# Patient Record
Sex: Female | Born: 2000 | Race: White | Hispanic: No | Marital: Single | Smoking: Never smoker
Health system: Southern US, Community
[De-identification: ages and names within clinical notes are randomized; demographics above are authoritative.]

## PROBLEM LIST (undated history)

## (undated) DIAGNOSIS — D239 Other benign neoplasm of skin, unspecified: Secondary | ICD-10-CM

## (undated) DIAGNOSIS — F419 Anxiety disorder, unspecified: Secondary | ICD-10-CM

## (undated) HISTORY — PX: NO PAST SURGERIES: SHX2092

## (undated) HISTORY — DX: Other benign neoplasm of skin, unspecified: D23.9

## (undated) HISTORY — PX: WISDOM TOOTH EXTRACTION: SHX21

## (undated) HISTORY — DX: Anxiety disorder, unspecified: F41.9

---
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
## 2019-03-18 ENCOUNTER — Ambulatory Visit (INDEPENDENT_AMBULATORY_CARE_PROVIDER_SITE_OTHER): Payer: Managed Care, Other (non HMO) | Admitting: Obstetrics and Gynecology

## 2019-03-18 ENCOUNTER — Encounter: Payer: Self-pay | Admitting: Obstetrics and Gynecology

## 2019-03-18 ENCOUNTER — Other Ambulatory Visit: Payer: Self-pay

## 2019-03-18 VITALS — BP 132/80 | HR 109 | Ht 69.0 in | Wt 163.0 lb

## 2019-03-18 DIAGNOSIS — N760 Acute vaginitis: Secondary | ICD-10-CM

## 2019-03-18 DIAGNOSIS — N3001 Acute cystitis with hematuria: Secondary | ICD-10-CM

## 2019-03-18 DIAGNOSIS — Z113 Encounter for screening for infections with a predominantly sexual mode of transmission: Secondary | ICD-10-CM | POA: Diagnosis not present

## 2019-03-18 MED ORDER — NITROFURANTOIN MONOHYD MACRO 100 MG PO CAPS
100.0000 mg | ORAL_CAPSULE | Freq: Two times a day (BID) | ORAL | 0 refills | Status: DC
Start: 2019-03-18 — End: 2019-09-21

## 2019-03-18 NOTE — Progress Notes (Signed)
Patient ID: Colleen Lozano, female   DOB: 2001/07/01, 18 y.o.   MRN: [id number]  Reason for Consult: Exposure to STD (Poss uti, frequent urination, buring and LQ pain )   Referred by [place]  Subjective:     HPI:  Colleen Lozano is a 18 y.o. female . She was recently sexually active for the first time 1 week ago. She is concerned that she may have gotten an STD, may be pregnant, and may have a urinary tract infection. She has been using an IUD for birth control. She has symptoms of pain with urination, burning with urination and increased urinary frequency. She feels like she has noticed a change in her vaginal odor.  Reports that she has not had a period since insertion of the IUD several months ago.   Past Medical History:  Diagnosis Date  . Anxiety   . Epidermal nevus 18 months   Family History  Problem Relation Age of Onset  . Hypertension Mother   . Hypertension Father    Past Surgical History:  Procedure Laterality Date  . NO PAST SURGERIES      Short Social History:  Social History   Tobacco Use  . Smoking status: Never Smoker  . Smokeless tobacco: Never Used  Substance Use Topics  . Alcohol use: No    Allergies  Allergen Reactions  . Clindamycin/Lincomycin Anaphylaxis    Current Outpatient Medications  Medication Sig Dispense Refill  . acitretin (SORIATANE) 25 MG capsule Take 35 mg by mouth daily before breakfast.    . fluocinonide ointment (LIDEX) 0.05 % Apply to affected nails daily    . fluticasone (FLONASE) 50 MCG/ACT nasal spray Place 1 spray into both nostrils daily.    Marland Kitchen levocetirizine (XYZAL) 5 MG tablet Take 5 mg by mouth every evening.    . montelukast (SINGULAIR) 10 MG tablet Take 10 mg by mouth at bedtime.    . sertraline (ZOLOFT) 50 MG tablet     . Levonorgestrel (KYLEENA) 19.5 MG IUD 1 each (19.5 mg total) by Intrauterine route once for 1 dose. 1 Intra Uterine Device 0   No current facility-administered medications for  this visit.     Review of Systems  Constitutional: Negative for chills, fatigue, fever and unexpected weight change.  HENT: Negative for trouble swallowing.  Eyes: Negative for loss of vision.  Respiratory: Negative for cough, shortness of breath and wheezing.  Cardiovascular: Negative for chest pain, leg swelling, palpitations and syncope.  GI: Negative for abdominal pain, blood in stool, diarrhea, nausea and vomiting.  GU: Positive for difficulty urinating, dysuria and frequency. Negative for hematuria.  Musculoskeletal: Negative for back pain, leg pain and joint pain.  Skin: Negative for rash.  Neurological: Negative for dizziness, headaches, light-headedness, numbness and seizures.  Psychiatric: Negative for behavioral problem, confusion, depressed mood and sleep disturbance.        Objective:  Objective   Vitals:   03/18/19 1438  BP: 132/80  Pulse: (!) 109  Weight: 163 lb (73.9 kg)  Height: 5\' 9"  (1.753 m)   Body mass index is 24.07 kg/m.  Physical Exam Vitals signs and nursing note reviewed. Exam conducted with a chaperone present.  Constitutional:      Appearance: She is well-developed.  HENT:     Head: Normocephalic and atraumatic.  Eyes:     Pupils: Pupils are equal, round, and reactive to light.  Cardiovascular:     Rate and Rhythm: Normal rate and regular rhythm.  Pulmonary:     Effort: Pulmonary effort is normal. No respiratory distress.  Genitourinary:    General: Normal vulva.     Pubic Area: No rash or pubic lice.      Labia:        Right: No rash, tenderness or lesion.        Left: No rash, tenderness or lesion.      Vagina: Normal. No vaginal discharge or lesions.     Cervix: Normal.     Uterus: Normal.      Adnexa: Right adnexa normal and left adnexa normal.       Right: No tenderness or fullness.         Left: No tenderness or fullness.       Skin:    General: Skin is warm and dry.  Neurological:     Mental Status: She is alert and  oriented to person, place, and time.  Psychiatric:        Behavior: Behavior normal.        Thought Content: Thought content normal.        Judgment: Judgment normal.        Assessment/Plan:     18 yo  1. Suspected UTI, rx sent for macrobid. Urine culture pending.  2. Desires STD testing- Nuswab sent, will follow up in 1 month for ST lab testing.  3. Continue contraception with IUD  Discussed safe sex practices for avoiding STD treatment.  Patient reports that she has been vaccinated for HPV before.  Helped register her mychart account.   More than 25 minutes were spent face to face with the patient in the room with more than 50% of the time spent providing counseling and discussing the plan of management.      Adrian Prows MD [facility], [facility] 03/18/2019 2:48 PM

## 2019-03-18 NOTE — Patient Instructions (Signed)
Preventing Sexually Transmitted Infections, Adult Sexually transmitted infections (STIs) are diseases that are passed (transmitted) from person to person through bodily fluids exchanged during sex or sexual contact. Bodily fluids include saliva, semen, blood, vaginal mucus, and urine. You may have an increased risk for developing an STI if you have unprotected oral, vaginal, or anal sex. Some common STIs include:  Herpes.  Hepatitis B.  Chlamydia.  Gonorrhea.  Syphilis.  HPV (human papillomavirus).  HIV (human immunodeficiency virus), the virus that can cause AIDS (acquired immunodeficiency syndrome). How can I protect myself from sexually transmitted infections? The only way to completely prevent STIs is not to have sex of any kind (practice abstinence). This includes oral, vaginal, or anal sex. If you are sexually active, take these actions to lower your risk of getting an STI:  Have only one sex partner (be monogamous) or limit the number of sexual partners you have.  Stay up-to-date on immunizations. Certain vaccines can lower your risk of getting certain STIs, such as: ? Hepatitis A and B vaccines. You may have been vaccinated as a young child, but likely need a booster shot as a teen or young adult. ? HPV vaccine.  Use methods that prevent the exchange of body fluids between partners (barrier protection) every time you have sex. Barrier protection can be used during oral, vaginal, or anal sex. Commonly used barrier methods include: ? Female condom. ? Female condom. ? Dental dam.  Get tested regularly for STIs. Have your sexual partner get tested regularly as well.  Avoid mixing alcohol, drugs, and sex. Alcohol and drug use can affect your ability to make good decisions and can lead to risky sexual behaviors.  Ask your health care provider about taking pre-exposure prophylaxis (PrEP) to prevent HIV infection if you: ? Have a HIV-positive sexual partner. ? Have multiple sexual  partners or partners who do not know their HIV status, and do not regularly use a condom during sex. ? Use injection drugs and share needles. Birth control pills, injections, implants, and intrauterine devices (IUDs) do not protect against STIs. To prevent both STIs and pregnancy, always use a condom with another form of birth control. Some STIs, such as herpes, are spread through skin to skin contact. A condom does not protect you from getting such STIs. If you or your partner have herpes and there is an active flare with open sores, avoid all sexual contact. Why are these changes important? Taking steps to practice safe sex protects you and others. Many STIs can be cured. However, some STIs are not curable and will affect you for the rest of your life. STIs can be passed on to another person even if you do not have symptoms. What can happen if changes are not made? Certain STIs may:  Require you to take medicine for the rest of your life.  Affect your ability to have children (your fertility).  Increase your risk for developing another STI or certain serious health conditions, such as: ? Cervical cancer. ? Head and neck cancer. ? Pelvic inflammatory disease (PID) in women. ? Organ damage or damage to other parts of your body, if the infection spreads.  Be passed to a baby during childbirth. How are sexually transmitted infections treated? If you or your partner know or think that you may have an STI:  Talk with your health care provider about what can be done to treat it. Some STIs can be treated and cured with medicines.  For curable STIs, you and  your partner should avoid sex during treatment and for several days after treatment is complete.  You and your partner should both be treated at the same time, if there is any chance that your partner is infected as well. If you get treatment but your partner does not, your partner can re-infect you when you resume sexual contact.  Do not  have unprotected sex. Where to find more information Learn more about sexually transmitted diseases and infections from:  Centers for Disease Control and Prevention: ? More information about specific STIs: AppraiserFraud.fi ? Find places to get sexual health counseling and treatment for free or for a low cost: gettested.StoreMirror.com.cy  U.S. Department of Health and Human Services: http://white.info/.html Summary  The only way to completely prevent STIs is not to have sex (practice abstinence), including oral, vaginal, or anal sex.  STIs can spread through saliva, semen, blood, vaginal mucus, urine, or sexual contact.  If you do have sex, limit your number of sexual partners and use a barrier protection method every time you have sex.  If you develop an STI, get treated right away and ask your partner to be treated as well. Do not resume having sex until both of you have completed treatment for the STI. This information is not intended to replace advice given to you by your health care provider. Make sure you discuss any questions you have with your health care provider. Document Released: 11/14/2016 Document Revised: 04/24/2018 Document Reviewed: 11/14/2016 Elsevier Interactive Patient Education  2019 [employer] Practicing safe sex means taking steps before and during sex to reduce your risk of:  Getting an STD (sexually transmitted disease).  Giving your partner an STD.  Unwanted pregnancy. How can I practice safe sex? To practice safe sex:  Limit your sexual partners to only one partner who is having sex with only you.  Avoid using alcohol and recreational drugs before having sex. These substances can affect your judgment.  Before having sex with a new partner: ? Talk to your partner about past partners, past STDs, and drug use. ? You and your partner should be screened for STDs and discuss the  results with each other.  Check your body regularly for sores, blisters, rashes, or unusual discharge. If you notice any of these problems, visit your health care provider.  If you have symptoms of an infection or you are being treated for an STD, avoid sexual contact.  While having sex, use a condom. Make sure to: ? Use a condom every time you have vaginal, oral, or anal sex. Both females and males should wear condoms during oral sex. ? Keep condoms in place from the beginning to the end of sexual activity. ? Use a latex condom, if possible. Latex condoms offer the best protection. ? Use only water-based lubricants or oils to lubricate a condom. Using petroleum-based lubricants or oils will weaken the condom and increase the chance that it will break.  See your health care provider for regular screenings, exams, and tests for STDs.  Talk with your health care provider about the form of birth control (contraception) that is best for you.  Get vaccinated against hepatitis B and human papillomavirus (HPV).  If you are at risk of being infected with HIV (human immunodeficiency virus), talk with your health care provider about taking a prescription medicine to prevent HIV infection. You are considered at risk for HIV if: ? You are a man who has sex with other men. ? You are a  heterosexual man or woman who is sexually active with more than one partner. ? You take drugs by injection. ? You are sexually active with a partner who has HIV. This information is not intended to replace advice given to you by your health care provider. Make sure you discuss any questions you have with your health care provider. Document Released: 12/26/2004 Document Revised: 04/03/2016 Document Reviewed: 10/08/2015 Elsevier Interactive Patient Education  2019 [employer].

## 2019-03-21 LAB — URINE CULTURE

## 2019-03-23 LAB — NUSWAB VAGINITIS PLUS (VG+)
Candida albicans, NAA: NEGATIVE
Candida glabrata, NAA: NEGATIVE
Chlamydia trachomatis, NAA: NEGATIVE
Neisseria gonorrhoeae, NAA: NEGATIVE
Trich vag by NAA: NEGATIVE

## 2019-03-23 NOTE — Progress Notes (Signed)
Released to mychart with note, on antibiotics for UTI.

## 2019-04-19 ENCOUNTER — Other Ambulatory Visit: Payer: Managed Care, Other (non HMO)

## 2019-09-21 ENCOUNTER — Other Ambulatory Visit: Payer: Self-pay

## 2019-09-21 ENCOUNTER — Encounter: Payer: Self-pay | Admitting: Emergency Medicine

## 2019-09-21 ENCOUNTER — Ambulatory Visit
Admission: EM | Admit: 2019-09-21 | Discharge: 2019-09-21 | Disposition: A | Discharge status: Home / Self Care | Payer: Managed Care, Other (non HMO) | Attending: Family Medicine | Admitting: Family Medicine

## 2019-09-21 DIAGNOSIS — H579 Unspecified disorder of eye and adnexa: Secondary | ICD-10-CM | POA: Diagnosis not present

## 2019-09-21 DIAGNOSIS — H109 Unspecified conjunctivitis: Secondary | ICD-10-CM

## 2019-09-21 MED ORDER — MOXIFLOXACIN HCL 0.5 % OP SOLN: 1.0000 [drp] | Freq: Three times a day (TID) | OPHTHALMIC | 0 refills | Status: AC

## 2019-09-21 NOTE — ED Provider Notes (Signed)
[facility] ____________________________________________  Time seen: Approximately 6:46 PM  I have reviewed the triage vital signs and the nursing notes.   HISTORY  Chief Complaint No chief complaint on file.   HPI Colleen Lozano is a 18 y.o. female presenting for evaluation of bilateral eye redness, itching and drainage.  Patient reports initially started last Wednesday or Thursday and only to the right eye.  Reports shortly been went to her left eye.  Reports she has been having copious yellowish drainage with matting, stating the matting's so much that she has to pry her eyes open in the morning and some during the day.  Denies foreign body sensation.  Denies vision changes or loss of vision.  States her eyes felt itchy and irritated.  Does not use glasses or contacts.  Denies known trauma, foreign body, chemical exposure.  Reports her boyfriend now has similar complaints in the last few days.  Did do a mdlive visit, and was given Tobrex which she has used for the last 2 days without resolution.  History of seasonal allergies.  Denies recent cough, congestion, fevers or sickness.  Patient's last menstrual period was 09/14/2019.Denies pregnancy.    Past Medical History:  Diagnosis Date  . Anxiety   . Epidermal nevus 18 months    There are no active problems to display for this patient.   Past Surgical History:  Procedure Laterality Date  . NO PAST SURGERIES       No current facility-administered medications for this encounter.   Current Outpatient Medications:  .  acitretin (SORIATANE) 25 MG capsule, Take 35 mg by mouth daily before breakfast., Disp: , Rfl:  .  levocetirizine (XYZAL) 5 MG tablet, Take 5 mg by mouth every evening., Disp: , Rfl:  .  Levonorgestrel (KYLEENA) 19.5 MG IUD, 1 each (19.5 mg total) by Intrauterine route once for 1 dose., Disp: 1 Intra Uterine Device, Rfl: 0 .  montelukast (SINGULAIR) 10 MG tablet, Take 10 mg by mouth at bedtime.,  Disp: , Rfl:  .  sertraline (ZOLOFT) 50 MG tablet, , Disp: , Rfl:  .  tobramycin (TOBREX) 0.3 % ophthalmic solution, , Disp: , Rfl:  .  moxifloxacin (VIGAMOX) 0.5 % ophthalmic solution, Place 1 drop into both eyes 3 (three) times daily for 7 days., Disp: 3 mL, Rfl: 0  Allergies Clindamycin/lincomycin  Family History  Problem Relation Age of Onset  . Hypertension Mother   . Hypertension Father     Social History Social History   Tobacco Use  . Smoking status: Never Smoker  . Smokeless tobacco: Never Used  Substance Use Topics  . Alcohol use: No  . Drug use: No    Review of Systems Constitutional: No fever/chills Eyes: No visual changes. As above.  ENT: No sore throat. Cardiovascular: Denies chest pain. Respiratory: Denies shortness of breath. Gastrointestinal: No abdominal pain.   Musculoskeletal: Negative for back pain. Skin: Negative for rash.   ____________________________________________   PHYSICAL EXAM:  VITAL SIGNS: ED Triage Vitals  Enc Vitals Group     BP 09/21/19 1717 123/85     Pulse Rate 09/21/19 1717 75     Resp 09/21/19 1717 18     Temp 09/21/19 1717 98.9 F (37.2 C)     Temp Source 09/21/19 1717 Oral     SpO2 09/21/19 1717 100 %     Weight 09/21/19 1710 165 lb (74.8 kg)     Height 09/21/19 1710 5\' 9"  (1.753 m)     Head  Circumference --      Peak Flow --      Pain Score 09/21/19 1709 4     Pain Loc --      Pain Edu? --      Excl. in GC? --     Visual Acuity  Right Eye Distance: 20/25 Left Eye Distance: 20/25 Bilateral Distance: 20/25    Constitutional: Alert and oriented. Well appearing and in no acute distress. Eyes: Bilateral conjunctival diffuse injection with bilateral yellowish drainage and yellow surrounding crusting.  No surrounding tenderness, swelling or erythema bilaterally.  No appearance of foreign body.  PERRL. EOMI. ENT      Head: Normocephalic and atraumatic.      Nose: No congestion Cardiovascular: Normal rate,  regular rhythm. Grossly normal heart sounds.  Good peripheral circulation. Respiratory: Normal respiratory effort without tachypnea nor retractions. Breath sounds are clear and equal bilaterally. No wheezes, rales, rhonchi. Musculoskeletal: Steady gait. Neurologic:  Normal speech and language.Speech is normal. No gait instability.  Skin:  Skin is warm, dry and intact. No rash noted. Psychiatric: Mood and affect are normal. Speech and behavior are normal. Patient exhibits appropriate insight and judgment   ___________________________________________   LABS (all labs ordered are listed, but only abnormal results are displayed)  Labs Reviewed - No data to display ____________________________________________   PROCEDURES Procedures   INITIAL IMPRESSION / ASSESSMENT AND PLAN / ED COURSE  Pertinent labs & imaging results that were available during my care of the patient were reviewed by me and considered in my medical decision making (see chart for details).  Well-appearing patient.  No acute distress.  Suspect bacterial conjunctivitis.  Will treat with Vigamox.  Stop previous antibiotic drops.  Avoidance of rubbing.  Cool compresses.  Good hand hygiene.  Supportive care.Discussed indication, risks and benefits of medications with patient.  Discussed follow up with Primary care physician this week. Discussed follow up and return parameters including no resolution or any worsening concerns. Patient verbalized understanding and agreed to plan.   ____________________________________________   FINAL CLINICAL IMPRESSION(S) / ED DIAGNOSES  Final diagnoses:  Bacterial conjunctivitis     ED Discharge Orders         Ordered    moxifloxacin (VIGAMOX) 0.5 % ophthalmic solution  3 times daily     09/21/19 1744           Note: This dictation was prepared with Dragon dictation along with smaller phrase technology. Any transcriptional errors that result from this process are unintentional.          Marylene Land, NP 09/21/19 [phone]

## 2019-09-21 NOTE — Discharge Instructions (Signed)
Take medication as prescribed. Avoid rubbing. Good hand hygiene. Continue allergy medication.   Follow up with your primary care physician this week as needed. Return to Urgent care for new or worsening concerns.

## 2019-09-21 NOTE — ED Triage Notes (Addendum)
Patient in office today c/o bilateral conjunctivitis. X 5d  Contacted MD live on 09/18/2019 and antibiotic was Tobrex prescribed. Pt states film over eyes aw/ crusty OTC: none  Visual acuity completed: Bilateral 20/25 w/o correction                                         Left 20/25                                         Right 20/25

## 2019-11-24 IMAGING — CR DG LUMBAR SPINE COMPLETE 4+V
5 series · 5 of 5 positions shown · non-contrast
Comparison: None.

CLINICAL DATA: Chronic low back pain

EXAM:
LUMBAR SPINE - COMPLETE 4+ VIEW

[l-spine ap]
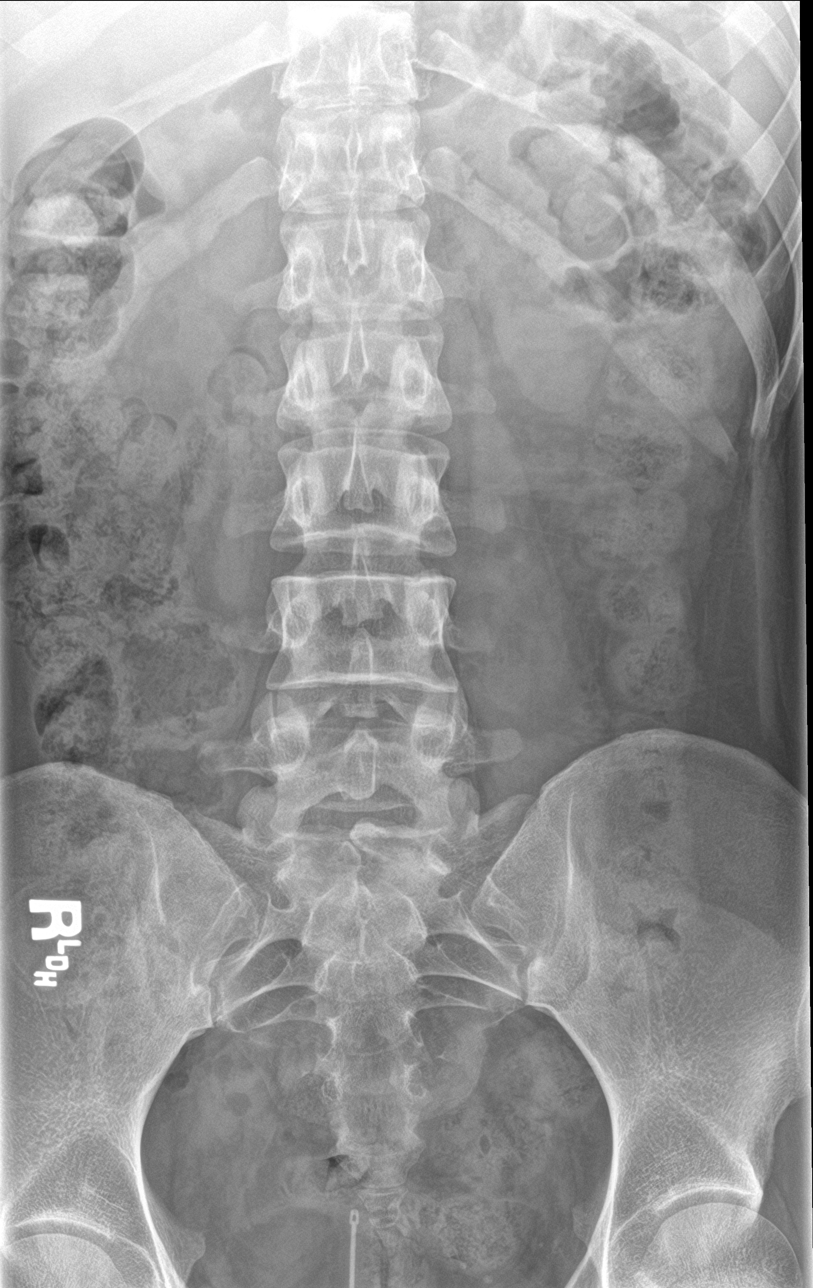

[l-spine obl (1 of 2)]
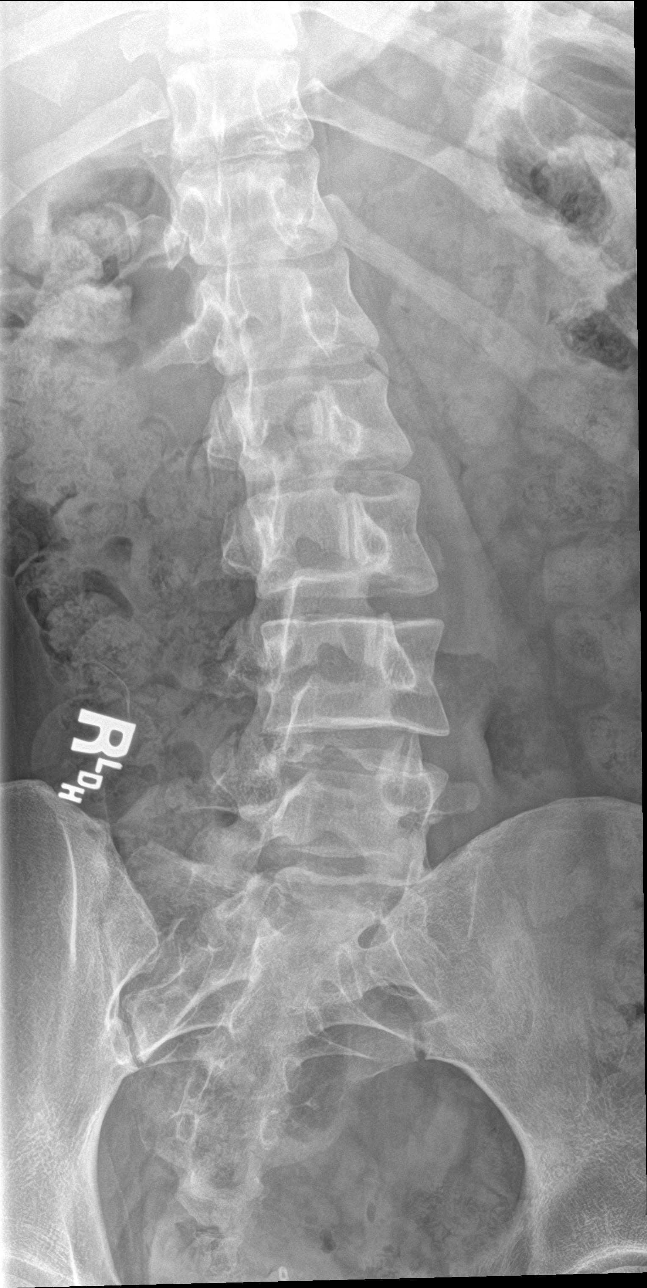

[l-spine obl (2 of 2)]
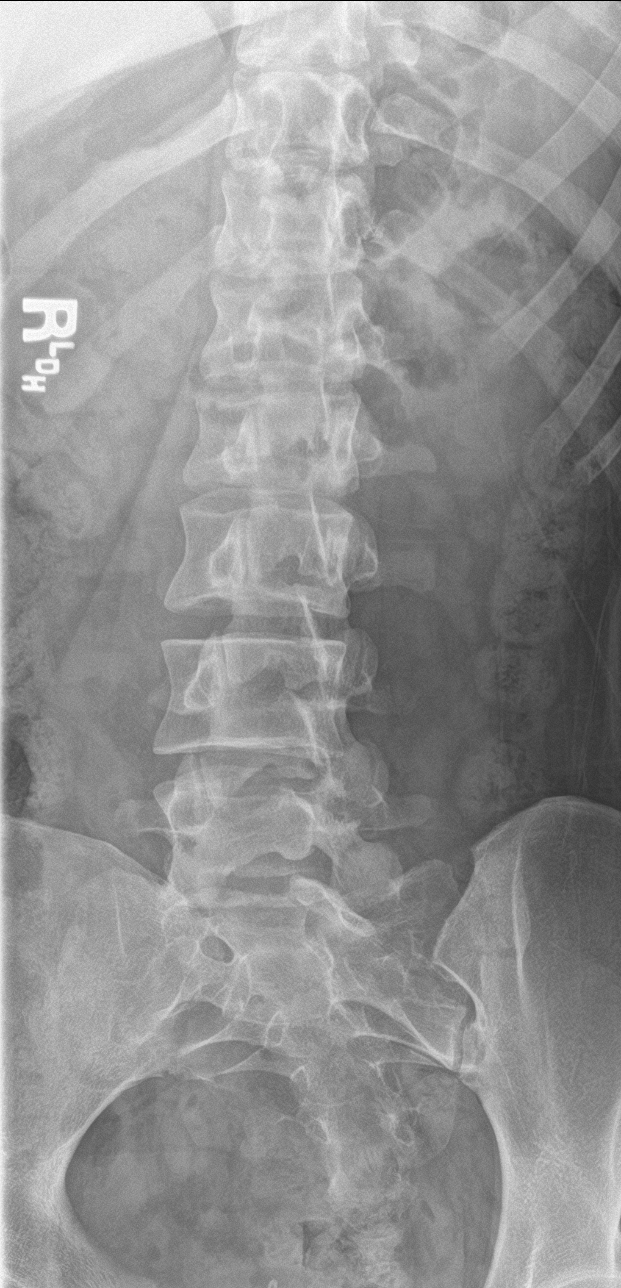

[l-spine lat]
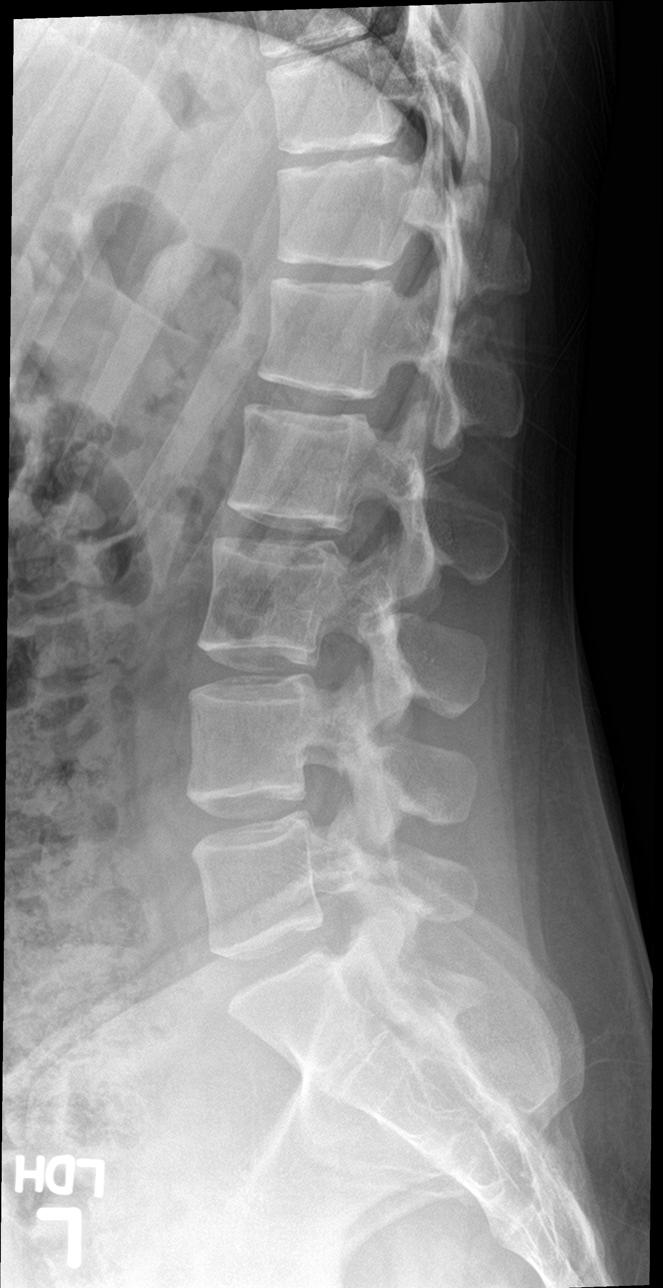

[l-spine spot]
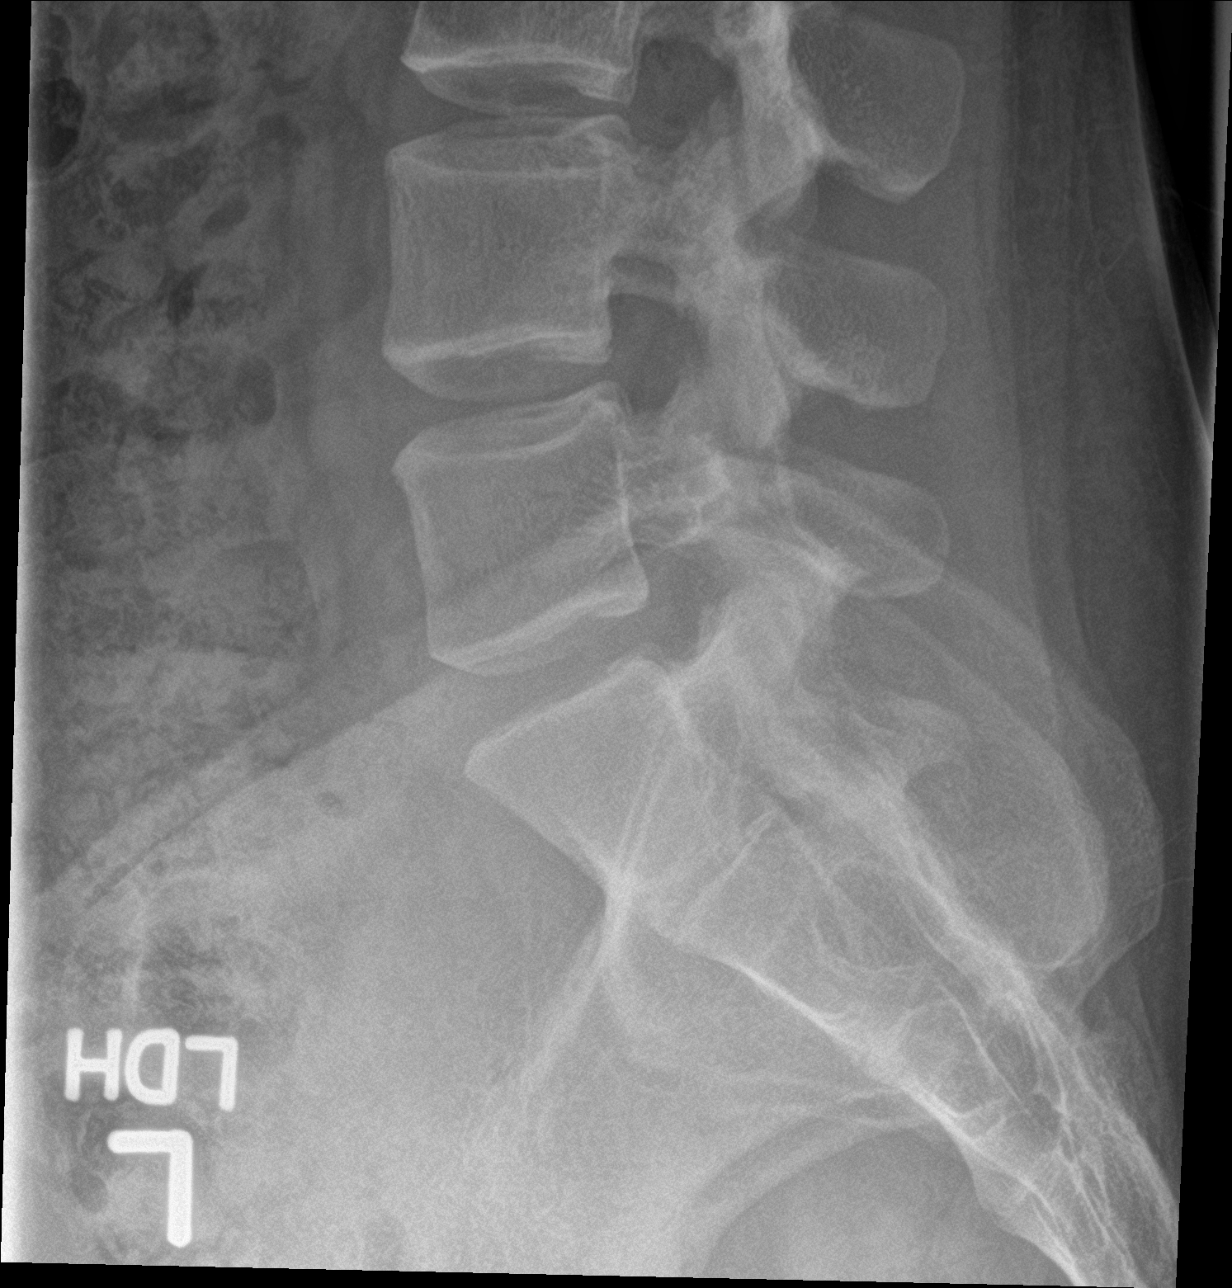

[5 of 5 positions shown; findings below may reference images not displayed]

FINDINGS: Five lumbar-type vertebral bodies.

Normal lumbar lordosis.

No evidence of fracture or dislocation. Vertebral body heights and
intervertebral disc spaces are maintained.

Visualized bony pelvis appears intact.
IMPRESSION: Negative.

## 2020-02-17 ENCOUNTER — Ambulatory Visit: Payer: Managed Care, Other (non HMO) | Attending: Internal Medicine

## 2020-02-17 DIAGNOSIS — Z20822 Contact with and (suspected) exposure to covid-19: Secondary | ICD-10-CM

## 2020-02-19 LAB — NOVEL CORONAVIRUS, NAA: SARS-CoV-2, NAA: NOT DETECTED

## 2020-06-01 ENCOUNTER — Ambulatory Visit: Payer: Self-pay

## 2020-06-27 ENCOUNTER — Telehealth: Payer: Self-pay

## 2020-06-27 NOTE — Telephone Encounter (Signed)
I sent Colleen Lozano a message.

## 2020-06-27 NOTE — Telephone Encounter (Signed)
Pt aware no appts available; BV doesn't cause pain; go back to urgent care or ED unless can wait to be seen by [place] next week. Pt states she'll be seen either at urgent care of ED.

## 2020-06-27 NOTE — Telephone Encounter (Signed)
Velva Harman-      It sounds like there may be something else going on, as BV is usually not painful.  I am confused- does Urgent Care turn people away because there are "no appointments"?  The whole point of Urgent care is for walk ins! LOL.   She could go to the ED if she chooses....  If I have time racing over from being on call now, then I can try to call the patient. This needs more exploration and information.

## 2020-06-27 NOTE — Telephone Encounter (Addendum)
Pt's mom calling; pt was seen at urgent care; dx'd c BV; the medication they gave her is not clearing it up; is now in a lot of pain.  [phone]  Pt states went to urgent care ~3wks ago; front desk said there were no openings. Adv needed to be seen either by [place], urgent care, or primary care which she has seen as well with no help just confusion. Pt doesn't know what to do.  Adv I will try to get her seen this week.

## 2020-08-08 ENCOUNTER — Other Ambulatory Visit: Payer: Self-pay

## 2020-08-08 ENCOUNTER — Ambulatory Visit
Admission: RE | Admit: 2020-08-08 | Discharge: 2020-08-08 | Disposition: A | Discharge status: Home / Self Care | Payer: Managed Care, Other (non HMO) | Source: Ambulatory Visit | Attending: Family Medicine | Admitting: Family Medicine

## 2020-08-08 VITALS — BP 131/81 | HR 82 | Temp 98.5°F | Resp 18 | Ht 69.0 in | Wt 160.0 lb

## 2020-08-08 DIAGNOSIS — U071 COVID-19: Secondary | ICD-10-CM | POA: Diagnosis not present

## 2020-08-08 DIAGNOSIS — R05 Cough: Secondary | ICD-10-CM | POA: Diagnosis not present

## 2020-08-08 DIAGNOSIS — Z20822 Contact with and (suspected) exposure to covid-19: Secondary | ICD-10-CM

## 2020-08-08 NOTE — Discharge Instructions (Addendum)
You were seen at the Urgent Care after testing positive for COVID Take OTC pain medication for headache. Be sure to stay hydrated . Follow up with your PCP as needed.   If you haven't already, sign up for My Chart to have easy access to your labs results, and communication with your primary care physician.  Dr. Susa Simmonds

## 2020-08-08 NOTE — ED Provider Notes (Signed)
[facility]    CSN: [id number] Arrival date & time: 08/08/20  1401      History   Chief Complaint Chief Complaint  Patient presents with  . Appointment  . Covid Positive  . Cough  . Nasal Congestion  . loss of taste and smell    HPI Colleen Lozano is a 19 y.o. female.   HPI    Patint reports she has bad allergies.  She is use to congestion, loss of smell and taste are normal to her.  She started getting headaches, produictive cough.  Thursday last week it was negative then the next day it turned positive (home COVID test). Taking Vit D and C, mucinex and ibuprofen. Deneos fevers, sore throat, nausea, vomiting, diarrhea, shortness of breath or chest pain. Patient's aunt test poisitive and pt was last around her last week. Patient is  not vaccinated against COVID.   Past Medical History:  Diagnosis Date  . Anxiety   . Epidermal nevus 18 months    There are no problems to display for this patient.   Past Surgical History:  Procedure Laterality Date  . NO PAST SURGERIES      OB History    Gravida  0   Para  0   Term  0   Preterm  0   AB  0   Living  0     SAB  0   TAB  0   Ectopic  0   Multiple  0   Live Births  0            Home Medications    Prior to Admission medications   Medication Sig Start Date End Date Taking? Authorizing Provider  acitretin (SORIATANE) 25 MG capsule Take 35 mg by mouth daily before breakfast.   Yes [provider]  levocetirizine (XYZAL) 5 MG tablet Take 5 mg by mouth every evening.   Yes [provider]  Levonorgestrel (KYLEENA) 19.5 MG IUD 1 each (19.5 mg total) by Intrauterine route once for 1 dose. 01/28/18 01/04/75 Yes Copland, Elmo Putt B, PA-C  montelukast (SINGULAIR) 10 MG tablet Take 10 mg by mouth at bedtime.   Yes [provider]  sertraline (ZOLOFT) 50 MG tablet  03/02/19  Yes [provider]  tobramycin (TOBREX) 0.3 % ophthalmic solution  09/18/19   [provider]  fluticasone (FLONASE) 50 MCG/ACT nasal spray Place 1 spray into both nostrils daily.  09/21/19  [provider]    Family History Family History  Problem Relation Age of Onset  . Hypertension Mother   . Hypertension Father     Social History Social History   Tobacco Use  . Smoking status: Never Smoker  . Smokeless tobacco: Never Used  Vaping Use  . Vaping Use: Never used  Substance Use Topics  . Alcohol use: No  . Drug use: No     Allergies   Clindamycin/lincomycin   Review of Systems Review of Systems: See HPI    Physical Exam Triage Vital Signs ED Triage Vitals  Enc Vitals Group     BP 08/08/20 1451 131/81     Pulse Rate 08/08/20 1451 82     Resp 08/08/20 1451 18     Temp 08/08/20 1451 98.5 F (36.9 C)     Temp Source 08/08/20 1451 Oral     SpO2 08/08/20 1451 98 %     Weight 08/08/20 1452 160 lb (72.6 kg)     Height 08/08/20  1452 5\' 9"  (1.753 m)     Head Circumference --      Peak Flow --      Pain Score 08/08/20 1451 0     Pain Loc --      Pain Edu? --      Excl. in [place]? --    No data found.  Updated Vital Signs BP 131/81 (BP Location: Left Arm)   Pulse 82   Temp 98.5 F (36.9 C) (Oral)   Resp 18   Ht 5\' 9"  (1.753 m)   Wt 160 lb (72.6 kg)   LMP 08/04/2020 (Exact Date)   SpO2 98%   BMI 23.63 kg/m   Visual Acuity Right Eye Distance:   Left Eye Distance:   Bilateral Distance:    Right Eye Near:   Left Eye Near:    Bilateral Near:     Physical Exam Vitals and nursing note reviewed.  Constitutional:      General: She is not in acute distress.    Appearance: Normal appearance. She is well-developed. She is not ill-appearing.  HENT:     Head: Normocephalic and atraumatic.     Right Ear: Tympanic membrane, ear canal and external ear normal.     Left Ear: Tympanic membrane, ear canal and external ear normal.     Nose: Nose normal.     Mouth/Throat:     Mouth: Mucous membranes are moist.     Pharynx: Oropharynx  is clear. No oropharyngeal exudate or posterior oropharyngeal erythema.  Eyes:     Extraocular Movements: Extraocular movements intact.     Conjunctiva/sclera: Conjunctivae normal.     Pupils: Pupils are equal, round, and reactive to light.  Cardiovascular:     Rate and Rhythm: Normal rate and regular rhythm.     Pulses: Normal pulses.     Heart sounds: Normal heart sounds. No murmur heard.   Pulmonary:     Effort: Pulmonary effort is normal. No respiratory distress.     Breath sounds: Normal breath sounds.  Abdominal:     Palpations: Abdomen is soft.     Tenderness: There is no abdominal tenderness.  Musculoskeletal:     Cervical back: Normal range of motion and neck supple. No tenderness.  Lymphadenopathy:     Cervical: No cervical adenopathy.  Skin:    General: Skin is warm and dry.  Neurological:     Mental Status: She is alert and oriented to person, place, and time. Mental status is at baseline.  Psychiatric:        Mood and Affect: Mood normal.        Thought Content: Thought content normal.        Judgment: Judgment normal.       UC Treatments / Results  Labs (all labs ordered are listed, but only abnormal results are displayed) Labs Reviewed  SARS CORONAVIRUS 2 (TAT 6-24 HRS)    EKG   Radiology No results found.  Procedures Procedures (including critical care time)  Medications Ordered in UC Medications - No data to display  Initial Impression / Assessment and Plan / UC Course  I have reviewed the triage vital signs and the nursing notes.  Pertinent labs & imaging results that were available during my care of the patient were reviewed by me and considered in my medical decision making (see chart for details).     History consistent with viral illness. Given patient's COVID exposure and positive home test. COVID test pending. Discussed quarantine regardless  of test result.  Overall pt is well appearing, well hydrated, without respiratory distress.  Discussed symptomatic treatment.  - continue to monitor for fevers  - continue Tylenol/ Motrin as needed for discomfort - nasal saline to help with his nasal congestion - Use a cool mist humidifier at bedtime to help with breathing - Stressed hydration - Discussed ED precautions, understanding voiced  Final Clinical Impressions(s) / UC Diagnoses   Final diagnoses:  Cough with exposure to COVID-19 virus     Discharge Instructions     You were seen at the Urgent Care after testing positive for COVID Take OTC pain medication for headache. Be sure to stay hydrated . Follow up with your PCP as needed.   If you haven't already, sign up for My Chart to have easy access to your labs results, and communication with your primary care physician.  Dr. Susa Simmonds      ED Prescriptions    None     PDMP not reviewed this encounter.   Lyndee Hensen, DO 08/08/20 1519

## 2020-08-08 NOTE — ED Triage Notes (Signed)
Patient in today c/o headache, cough, nasal congestion and loss of taste and smell x 6 days. Patient took an at home covid test on 08/03/20 and it was negative. Patient took another at home covid test on 08/04/20 and it was positive. Patient requesting a PCR test.

## 2020-08-09 LAB — SARS CORONAVIRUS 2 (TAT 6-24 HRS): SARS Coronavirus 2: POSITIVE — AB

## 2020-08-10 ENCOUNTER — Telehealth (HOSPITAL_COMMUNITY): Payer: Self-pay | Admitting: Nurse Practitioner

## 2020-08-10 NOTE — Telephone Encounter (Signed)
Called to discuss with Colleen Lozano about Covid symptoms and the use of bamlanivimab, a monoclonal antibody infusion for those with mild to moderate Covid symptoms and at a high risk of hospitalization.     Pt is not qualified for this infusion due to lack of identified risk factors and co-morbid conditions- not currently having symptoms.  Symptoms reviewed as well as criteria for ending isolation.  Preventative practices reviewed. Patient verbalized understanding.     Colleen Lozano, AGNP-C

## 2020-09-10 ENCOUNTER — Ambulatory Visit: Payer: Self-pay

## 2020-11-27 ENCOUNTER — Other Ambulatory Visit: Payer: Self-pay

## 2020-11-27 ENCOUNTER — Encounter: Payer: Self-pay | Admitting: Otolaryngology

## 2020-12-05 ENCOUNTER — Other Ambulatory Visit: Payer: Self-pay

## 2020-12-05 ENCOUNTER — Other Ambulatory Visit
Admission: RE | Admit: 2020-12-05 | Discharge: 2020-12-05 | Disposition: A | Discharge status: Home / Self Care | Payer: Managed Care, Other (non HMO) | Source: Ambulatory Visit | Attending: Otolaryngology | Admitting: Otolaryngology

## 2020-12-05 DIAGNOSIS — Z01812 Encounter for preprocedural laboratory examination: Secondary | ICD-10-CM | POA: Diagnosis present

## 2020-12-05 DIAGNOSIS — Z20822 Contact with and (suspected) exposure to covid-19: Secondary | ICD-10-CM | POA: Insufficient documentation

## 2020-12-05 LAB — SARS CORONAVIRUS 2 (TAT 6-24 HRS): SARS Coronavirus 2: NEGATIVE

## 2020-12-07 ENCOUNTER — Ambulatory Visit: Payer: Managed Care, Other (non HMO) | Admitting: Anesthesiology

## 2020-12-07 ENCOUNTER — Other Ambulatory Visit: Payer: Self-pay

## 2020-12-07 ENCOUNTER — Ambulatory Visit
Admission: RE | Admit: 2020-12-07 | Discharge: 2020-12-07 | Disposition: A | Discharge status: Home / Self Care | Payer: Managed Care, Other (non HMO) | Source: Ambulatory Visit | Attending: Otolaryngology | Admitting: Otolaryngology

## 2020-12-07 ENCOUNTER — Encounter: Payer: Self-pay | Admitting: Otolaryngology

## 2020-12-07 ENCOUNTER — Encounter
Admission: RE | Disposition: A | Discharge status: Home / Self Care | Payer: Self-pay | Source: Ambulatory Visit | Attending: Otolaryngology

## 2020-12-07 DIAGNOSIS — J3501 Chronic tonsillitis: Secondary | ICD-10-CM | POA: Diagnosis present

## 2020-12-07 DIAGNOSIS — Z881 Allergy status to other antibiotic agents status: Secondary | ICD-10-CM | POA: Insufficient documentation

## 2020-12-07 HISTORY — PX: TONSILLECTOMY: SHX5217

## 2020-12-07 LAB — POCT PREGNANCY, URINE: Preg Test, Ur: NEGATIVE

## 2020-12-07 SURGERY — TONSILLECTOMY
Anesthesia: General | Site: Throat | Laterality: Bilateral

## 2020-12-07 MED ORDER — PROPOFOL 10 MG/ML IV BOLUS
INTRAVENOUS | Status: DC | PRN
  Administered 2020-12-07: 150 mg via INTRAVENOUS

## 2020-12-07 MED ORDER — HYDROCODONE-ACETAMINOPHEN 7.5-325 MG/15ML PO SOLN: 15.0000 mL | ORAL | 0 refills | Status: AC | PRN

## 2020-12-07 MED ORDER — DEXAMETHASONE SODIUM PHOSPHATE 4 MG/ML IJ SOLN
INTRAMUSCULAR | Status: DC | PRN
  Administered 2020-12-07: 10 mg via INTRAVENOUS

## 2020-12-07 MED ORDER — LACTATED RINGERS IV SOLN: INTRAVENOUS | Status: DC

## 2020-12-07 MED ORDER — LIDOCAINE HCL 4 % MT SOLN
OROMUCOSAL | Status: DC | PRN
  Administered 2020-12-07: 4 mL via TOPICAL

## 2020-12-07 MED ORDER — GLYCOPYRROLATE 0.2 MG/ML IJ SOLN
INTRAMUSCULAR | Status: DC | PRN
  Administered 2020-12-07: .1 mg via INTRAVENOUS

## 2020-12-07 MED ORDER — FENTANYL CITRATE (PF) 100 MCG/2ML IJ SOLN
25.0000 ug | INTRAMUSCULAR | Status: DC | PRN
Start: 2020-12-07 — End: 2020-12-07
  Administered 2020-12-07: 25 ug via INTRAVENOUS

## 2020-12-07 MED ORDER — LIDOCAINE HCL (CARDIAC) PF 100 MG/5ML IV SOSY
PREFILLED_SYRINGE | INTRAVENOUS | Status: DC | PRN
  Administered 2020-12-07: 50 mg via INTRAVENOUS

## 2020-12-07 MED ORDER — FENTANYL CITRATE (PF) 100 MCG/2ML IJ SOLN
INTRAMUSCULAR | Status: DC | PRN
  Administered 2020-12-07: 25 ug via INTRAVENOUS
  Administered 2020-12-07: 100 ug via INTRAVENOUS

## 2020-12-07 MED ORDER — OXYCODONE HCL 5 MG PO TABS: 5.0000 mg | ORAL_TABLET | Freq: Once | ORAL | Status: AC | PRN

## 2020-12-07 MED ORDER — ONDANSETRON HCL 4 MG/2ML IJ SOLN
INTRAMUSCULAR | Status: DC | PRN
  Administered 2020-12-07: 4 mg via INTRAVENOUS

## 2020-12-07 MED ORDER — ONDANSETRON HCL 4 MG/2ML IJ SOLN: 4.0000 mg | Freq: Once | INTRAMUSCULAR | Status: DC | PRN

## 2020-12-07 MED ORDER — MIDAZOLAM HCL 5 MG/5ML IJ SOLN
INTRAMUSCULAR | Status: DC | PRN
  Administered 2020-12-07: 2 mg via INTRAVENOUS

## 2020-12-07 MED ORDER — SCOPOLAMINE 1 MG/3DAYS TD PT72
1.0000 | MEDICATED_PATCH | TRANSDERMAL | Status: DC
  Administered 2020-12-07: 1.5 mg via TRANSDERMAL

## 2020-12-07 MED ORDER — DEXMEDETOMIDINE HCL 200 MCG/2ML IV SOLN
INTRAVENOUS | Status: DC | PRN
  Administered 2020-12-07: 5 ug via INTRAVENOUS
  Administered 2020-12-07: 10 ug via INTRAVENOUS

## 2020-12-07 MED ORDER — OXYCODONE HCL 5 MG/5ML PO SOLN
5.0000 mg | Freq: Once | ORAL | Status: AC | PRN
Start: 2020-12-07 — End: 2020-12-07
  Administered 2020-12-07: 5 mg via ORAL

## 2020-12-07 MED ORDER — ACETAMINOPHEN 10 MG/ML IV SOLN
1000.0000 mg | Freq: Once | INTRAVENOUS | Status: AC
  Administered 2020-12-07: 1000 mg via INTRAVENOUS

## 2020-12-07 SURGICAL SUPPLY — 13 items
BLADE BOVIE TIP EXT 4 (BLADE) ×2 IMPLANT
CANISTER SUCT 1200ML W/VALVE (MISCELLANEOUS) ×2 IMPLANT
ELECT REM PT RETURN 9FT ADLT (ELECTROSURGICAL) ×2
ELECTRODE REM PT RTRN 9FT ADLT (ELECTROSURGICAL) ×1 IMPLANT
GLOVE PI ULTRA LF STRL 7.5 (GLOVE) ×2 IMPLANT
GLOVE PI ULTRA NON LATEX 7.5 (GLOVE) ×2
KIT TURNOVER KIT A (KITS) ×2 IMPLANT
PACK TONSIL AND ADENOID CUSTOM (PACKS) ×2 IMPLANT
PENCIL SMOKE EVACUATOR (MISCELLANEOUS) ×2 IMPLANT
SLEEVE SUCTION 125 (MISCELLANEOUS) ×2 IMPLANT
SOL ANTI-FOG 6CC FOG-OUT (MISCELLANEOUS) ×1 IMPLANT
SOL FOG-OUT ANTI-FOG 6CC (MISCELLANEOUS) ×1
STRAP BODY AND KNEE 60X3 (MISCELLANEOUS) ×2 IMPLANT

## 2020-12-07 NOTE — Discharge Instructions (Signed)
T & A INSTRUCTION SHEET - [facility]  Margaretha Sheffield, MD  [address] [place], [place] [address] TEL.  [phone]  INFORMATION SHEET FOR A TONSILLECTOMY AND ADENDOIDECTOMY  About Your Tonsils and Adenoids  The tonsils and adenoids are normal body tissues that are part of our immune system.  They normally help to protect [place] against diseases that may enter our mouth and nose. However, sometimes the tonsils and/or adenoids become too large and obstruct our breathing, especially at night.    If either of these things happen it helps to remove the tonsils and adenoids in order to become healthier. The operation to remove the tonsils and adenoids is called a tonsillectomy and adenoidectomy.  The Location of Your Tonsils and Adenoids  The tonsils are located in the back of the throat on both side and sit in a cradle of muscles. The adenoids are located in the roof of the mouth, behind the nose, and closely associated with the opening of the Eustachian tube to the ear.  Surgery on Tonsils and Adenoids  A tonsillectomy and adenoidectomy is a short operation which takes about thirty minutes.  This includes being put to sleep and being awakened. Tonsillectomies and adenoidectomies are performed at [facility] and may require observation period in the recovery room prior to going home. Children are required to remain in recovery for at least 45 minutes.   Following the Operation for a Tonsillectomy  A cautery machine is used to control bleeding. Bleeding from a tonsillectomy and adenoidectomy is minimal and postoperatively the risk of bleeding is approximately four percent, although this rarely life threatening.  After your tonsillectomy and adenoidectomy post-op care at home: 1. Our patients are able to go home the same day. You may be given prescriptions for pain medications, if indicated. 2. It is extremely important to  remember that fluid intake is of utmost importance after a tonsillectomy. The amount that you drink must be maintained in the postoperative period. A good indication of whether a child is getting enough fluid is whether his/her urine output is constant. As long as children are urinating or wetting their diaper every 6 - 8 hours this is usually enough fluid intake.   3. Although rare, this is a risk of some bleeding in the first ten days after surgery. This usually occurs between day five and nine postoperatively. This risk of bleeding is approximately four percent. If you or your child should have any bleeding you should remain calm and notify our office or go directly to the emergency room at [facility] where they will contact us. Our doctors are available seven days a week for notification. We recommend sitting up quietly in a chair, place an ice pack on the front of the neck and spitting out the blood gently until we are able to contact you. Adults should gargle gently with ice water and this may help stop the bleeding. If the bleeding does not stop after a short time, i.e. 10 to 15 minutes, or seems to be increasing again, please contact us or go to the hospital.   4. It is common for the pain to be worse at 5 - 7 days postoperatively. This occurs because the scab is peeling off and the mucous membrane (skin of the throat) is growing back where the tonsils were.   5. It is common for a low-grade fever, less than 102, during the first week  after a tonsillectomy and adenoidectomy. It is usually due to not drinking enough liquids, and we suggest your use liquid Tylenol (acetaminophen) or the pain medicine with Tylenol (acetaminophen) prescribed in order to keep your temperature below 102. Please follow the directions on the back of the bottle. 6. Recommendations for post-operative pain in children and adults: a) For Children 12 and younger: Recommendations are for oral Tylenol  (acetaminophen) and oral Motrin (ibuprofen). Administer the Tylenol (acetaminophen) and Motrin as stated on bottle for patient's age/weight. Sometimes it may be necessary to alternate the Tylenol (acetaminophen) and Motrin for improved pain control. Motrin (ibuprofen) does last slightly longer so many patients benefit from being given this prior to bedtime. All children should avoid Aspirin products for 2 weeks following surgery. b) For children over the age of 31: Tylenol (acetaminophen) is the preferred first choice for pain control. Depending on your child's size, sometimes they will be given a combination of Tylenol (acetaminophen) and hydrocodone medication or sometimes it will be recommended they take Motrin (ibuprofen) in addition to the Tylenol (acetaminophen). Narcotics should always be used with caution in children following surgery as they can suppress their breathing and switching to over the counter Tylenol (acetaminophen) and Motrin (ibuprofen) as soon as possible is recommended. All patients should avoid Aspirin products for 2 weeks following surgery. c) Adults: Usually adults will require a narcotic pain medication following a tonsillectomy. This usually has either hydrocodone or oxycodone in it and can usually be taken every 4 to 6 hours as needed for moderate pain. If the medication does not have Tylenol (acetaminophen) in it, you may also supplement Tylenol (acetaminophen) as needed every 4 to 6 hours for breakthrough or mild pain. Adults should avoid Aspirin, Aleve, Motrin, and Ibuprofen products for 2 weeks following surgery as they can increase your risk of bleeding. 7. If you happen to look in the mirror or into your child's mouth you will see white/gray patches on the back of the throat. This is what a scab looks like in the mouth and is normal after having a tonsillectomy and adenoidectomy. They will disappear once the tonsil areas heal completely. However, it may cause a noticeable odor,  and this too will disappear with time.     8. You or your child may experience ear pain after having a tonsillectomy and adenoidectomy.  This is called referred pain and comes from the throat, but it is felt in the ears.  Ear pain is quite common and expected. It will usually go away after ten days. There is usually nothing wrong with the ears, and it is primarily due to the healing area stimulating the nerve to the ear that runs along the side of the throat. Use either the prescribed pain medicine or Tylenol (acetaminophen) as needed.  9. The throat tissues after a tonsillectomy are obviously sensitive. Smoking around children who have had a tonsillectomy significantly increases the risk of bleeding. DO NOT SMOKE!  What to Expect Each Day  First Day at Home 1. Patients will be discharged home the same day.  2. Drink at least four glasses of liquid a day. Clear, cool liquids are recommended. Fruit juices containing citric acid are not recommended because they tend to cause pain. Carbonated beverages are allowed if you pour them from glass to glass to remove the bubbles as these tend to cause discomfort. Avoid alcoholic beverages.  3. Eat very soft foods such as soups, broth, jello, custard, pudding, ice cream, popsicles, applesauce, mashed potatoes,  and in general anything that you can crush between your tongue and the roof of your mouth. Try adding [employer] Mix into your food for extra calories. It is not uncommon to lose 5 to 10 pounds of fluid weight. The weight will be gained back quickly once you're feeling better and drinking more.  4. Sleep with your head elevated on two pillows for about three days to help decrease the swelling.  5. DO NOT SMOKE!  Day Two  1. Rest as much as possible. Use common sense in your activities.  2. Continue drinking at least four glasses of liquid per day.  3. Follow the soft diet.  4. Use your pain medication as needed.  Day Three  1. Advance  your activity as you are able and continue to follow the previous day's suggestions.  Days Four Through Six  1. Advance your diet and begin to eat more solid foods such as chopped hamburger. 2. Advance your activities slowly. Children should be kept mostly around the house.  3. Not uncommonly, there will be more pain at this time. It is temporary, usually lasting a day or two.  Day Seven Through Ten  1. Most individuals by this time are able to return to work or school unless otherwise instructed. Consider sending children back to school for a half day on the first day back.  General Anesthesia, Adult, Care After This sheet gives you information about how to care for yourself after your procedure. Your health care provider may also give you more specific instructions. If you have problems or questions, contact your health care provider. What can I expect after the procedure? After the procedure, the following side effects are common:  Pain or discomfort at the IV site.  Nausea.  Vomiting.  Sore throat.  Trouble concentrating.  Feeling cold or chills.  Weak or tired.  Sleepiness and fatigue.  Soreness and body aches. These side effects can affect parts of the body that were not involved in surgery. Follow these instructions at home:  For at least 24 hours after the procedure:  Have a responsible adult stay with you. It is important to have someone help care for you until you are awake and alert.  Rest as needed.  Do not: ? Participate in activities in which you could fall or become injured. ? Drive. ? Use heavy machinery. ? Drink alcohol. ? Take sleeping pills or medicines that cause drowsiness. ? Make important decisions or sign legal documents. ? Take care of children on your own. Eating and drinking  Follow any instructions from your health care provider about eating or drinking restrictions.  When you feel hungry, start by eating small amounts of foods that are  soft and easy to digest (bland), such as toast. Gradually return to your regular diet.  Drink enough fluid to keep your urine pale yellow.  If you vomit, rehydrate by drinking water, juice, or clear broth. General instructions  If you have sleep apnea, surgery and certain medicines can increase your risk for breathing problems. Follow instructions from your health care provider about wearing your sleep device: ? Anytime you are sleeping, including during daytime naps. ? While taking prescription pain medicines, sleeping medicines, or medicines that make you drowsy.  Return to your normal activities as told by your health care provider. Ask your health care provider what activities are safe for you.  Take over-the-counter and prescription medicines only as told by your health care provider.  If you  smoke, do not smoke without supervision.  Keep all follow-up visits as told by your health care provider. This is important. Contact a health care provider if:  You have nausea or vomiting that does not get better with medicine.  You cannot eat or drink without vomiting.  You have pain that does not get better with medicine.  You are unable to pass urine.  You develop a skin rash.  You have a fever.  You have redness around your IV site that gets worse. Get help right away if:  You have difficulty breathing.  You have chest pain.  You have blood in your urine or stool, or you vomit blood. Summary  After the procedure, it is common to have a sore throat or nausea. It is also common to feel tired.  Have a responsible adult stay with you for the first 24 hours after general anesthesia. It is important to have someone help care for you until you are awake and alert.  When you feel hungry, start by eating small amounts of foods that are soft and easy to digest (bland), such as toast. Gradually return to your regular diet.  Drink enough fluid to keep your urine pale  yellow.  Return to your normal activities as told by your health care provider. Ask your health care provider what activities are safe for you. This information is not intended to replace advice given to you by your health care provider. Make sure you discuss any questions you have with your health care provider. Document Revised: 11/21/2017 Document Reviewed: 07/04/2017 Elsevier Patient Education  2020 Elsevier Inc.  Scopolamine skin patches What is this medicine? SCOPOLAMINE (skoe POL a meen) is used to prevent nausea and vomiting caused by motion sickness, anesthesia and surgery. This medicine may be used for other purposes; ask your health care provider or pharmacist if you have questions. COMMON BRAND NAME(S): Transderm Scop What should I tell my health care provider before I take this medicine? They need to know if you have any of these conditions:  are scheduled to have a gastric secretion test  glaucoma  heart disease  kidney disease  liver disease  lung or breathing disease, like asthma  mental illness  prostate disease  seizures  stomach or intestine problems  trouble passing urine  an unusual or allergic reaction to scopolamine, atropine, other medicines, foods, dyes, or preservatives  pregnant or trying to get pregnant  breast-feeding How should I use this medicine? This medicine is for external use only. Follow the directions on the prescription label. Wear only 1 patch at a time. Choose an area behind the ear, that is clean, dry, hairless and free from any cuts or irritation. Wipe the area with a clean dry tissue. Peel off the plastic backing of the skin patch, trying not to touch the adhesive side with your hands. Do not cut the patches. Firmly apply to the area you have chosen, with the metallic side of the patch to the skin and the tan-colored side showing. Once firmly in place, wash your hands well with soap and water. Do not get this medicine into your  eyes. After removing the patch, wash your hands and the area behind your ear thoroughly with soap and water. The patch will still contain some medicine after use. To avoid accidental contact or ingestion by children or pets, fold the used patch in half with the sticky side together and throw away in the trash out of the reach of children  and pets. If you need to use a second patch after you remove the first, place it behind the other ear. A special MedGuide will be given to you by the pharmacist with each prescription and refill. Be sure to read this information carefully each time. Talk to your pediatrician regarding the use of this medicine in children. Special care may be needed. Overdosage: If you think you have taken too much of this medicine contact a poison control center or emergency room at once. NOTE: This medicine is only for you. Do not share this medicine with others. What if I miss a dose? This does not apply. This medicine is not for regular use. What may interact with this medicine?  alcohol  antihistamines for allergy cough and cold  atropine  certain medicines for anxiety or sleep  certain medicines for bladder problems like oxybutynin, tolterodine  certain medicines for depression like amitriptyline, fluoxetine, sertraline  certain medicines for stomach problems like dicyclomine, hyoscyamine  certain medicines for Parkinson's disease like benztropine, trihexyphenidyl  certain medicines for seizures like phenobarbital, primidone  general anesthetics like halothane, isoflurane, methoxyflurane, propofol  ipratropium  local anesthetics like lidocaine, pramoxine, tetracaine  medicines that relax muscles for surgery  phenothiazines like chlorpromazine, mesoridazine, prochlorperazine, thioridazine  narcotic medicines for pain  other belladonna alkaloids This list may not describe all possible interactions. Give your health care provider a list of all the  medicines, herbs, non-prescription drugs, or dietary supplements you use. Also tell them if you smoke, drink alcohol, or use illegal drugs. Some items may interact with your medicine. What should I watch for while using this medicine? Limit contact with water while swimming and bathing because the patch may fall off. If the patch falls off, throw it away and put a new one behind the other ear. You may get drowsy or dizzy. Do not drive, use machinery, or do anything that needs mental alertness until you know how this medicine affects you. Do not stand or sit up quickly, especially if you are an older patient. This reduces the risk of dizzy or fainting spells. Alcohol may interfere with the effect of this medicine. Avoid alcoholic drinks. Your mouth may get dry. Chewing sugarless gum or sucking hard candy, and drinking plenty of water may help. Contact your healthcare professional if the problem does not go away or is severe. This medicine may cause dry eyes and blurred vision. If you wear contact lenses, you may feel some discomfort. Lubricating drops may help. See your healthcare professional if the problem does not go away or is severe. If you are going to need surgery, an MRI, CT scan, or other procedure, tell your healthcare professional that you are using this medicine. You may need to remove the patch before the procedure. What side effects may I notice from receiving this medicine? Side effects that you should report to your doctor or health care professional as soon as possible:  allergic reactions like skin rash, itching or hives; swelling of the face, lips, or tongue  blurred vision  changes in vision  confusion  dizziness  eye pain  fast, irregular heartbeat  hallucinations, loss of contact with reality  nausea, vomiting  pain or trouble passing urine  restlessness  seizures  skin irritation  stomach pain Side effects that usually do not require medical attention  (report to your doctor or health care professional if they continue or are bothersome):  drowsiness  dry mouth  headache  sore throat This list may  not describe all possible side effects. Call your doctor for medical advice about side effects. You may report side effects to FDA at 1-800-FDA-1088. Where should I keep my medicine? Keep out of the reach of children. Store at room temperature between 20 and 25 degrees C (68 and 77 degrees F). Keep this medicine in the foil package until ready to use. Throw away any unused medicine after the expiration date. NOTE: This sheet is a summary. It may not cover all possible information. If you have questions about this medicine, talk to your doctor, pharmacist, or health care provider.  2020 Elsevier/Gold Standard (2018-02-06 16:14:46)

## 2020-12-07 NOTE — Op Note (Signed)
12/07/2020  9:22 AM    Colleen Lozano  [id number]   Pre-Op Dx: Chronic tonsillitis, hypertrophied tonsils  Post-op Dx: Same  Proc: Tonsillectomy  Surg:  Colleen Lozano Colby Reels  Anes:  GOT  EBL: 20 mL  Comp: None  Findings: Extremely large tonsils with multiple tonsil tags near the tongue base on both sides  Procedure: Patient was brought to the operating room and placed in the supine position.  She was given general anesthesia by oral endotracheal intubation.  Once the patient was asleep a Vernelle Emerald was used to visualize the oropharynx.  The tonsils were extremely large.  The soft palate was retracted and the adenoids were not enlarged at all and the nasopharynx was clear.  The left tonsil was grasped and pulled medially and the anterior pillar incised.  The superior pole was hidden behind the anterior tonsillar pillar.  It was dissected from its fossa using blunt dissection and electrocautery.  This was carried down to the inferior pole where there were multiple tonsil tags that were removed as well near the lateral tongue base.  Bleeding was controlled with direct pressure and electrocautery.  The right tonsil was then grasped and pulled medially and the anterior pillar was incised.  The superior pole was again hidden behind the anterior tonsillar pillar.  The tonsil was dissected from its fossa using blunt dissection and electrocautery.  There is a lot of scar tissue to the underlying muscle bed.  There were multiple tonsil tags near the inferior pole and the lateral wall of the tongue base.  These were trimmed some as well.  Bleeding was controlled with direct pressure and electrocautery.  The patient was awakened and taken to the recovery room in satisfactory condition.  She tolerated the procedure well.  There were no operative complications.  Dispo:   To PACU to be discharged home  Plan: To follow-up in the office in 2 to 3 weeks.  She will push liquids at home and slowly  increase her diet as tolerated.  She is given a prescription of Tylenol with hydrocodone liquid to use for pain.  She can supplement with some Tylenol as well.  Colleen Lozano Colleen Lozano  12/07/2020 9:22 AM

## 2020-12-07 NOTE — H&P (Signed)
H&P has been reviewed and patient reevaluated, no changes necessary. To be downloaded later.  

## 2020-12-07 NOTE — Anesthesia Preprocedure Evaluation (Signed)
Anesthesia Evaluation  Patient identified by MRN, date of birth, ID band Patient awake    Reviewed: Allergy & Precautions, H&P , NPO status , Patient's Chart, lab work & pertinent test results  Airway Mallampati: II  TM Distance: >3 FB Neck ROM: full    Dental no notable dental hx.    Pulmonary    Pulmonary exam normal breath sounds clear to auscultation       Cardiovascular Normal cardiovascular exam Rhythm:regular Rate:Normal     Neuro/Psych PSYCHIATRIC DISORDERS    GI/Hepatic   Endo/Other    Renal/GU      Musculoskeletal   Abdominal   Peds  Hematology   Anesthesia Other Findings   Reproductive/Obstetrics                             Anesthesia Physical Anesthesia Plan  ASA: II  Anesthesia Plan: General ETT   Post-op Pain Management:    Induction:   PONV Risk Score and Plan: 3 and Treatment may vary due to age or medical condition, Dexamethasone, Ondansetron and Scopolamine patch - Pre-op  Airway Management Planned:   Additional Equipment:   Intra-op Plan:   Post-operative Plan:   Informed Consent: I have reviewed the patients History and Physical, chart, labs and discussed the procedure including the risks, benefits and alternatives for the proposed anesthesia with the patient or authorized representative who has indicated his/her understanding and acceptance.     Dental Advisory Given  Plan Discussed with: CRNA  Anesthesia Plan Comments:         Anesthesia Quick Evaluation

## 2020-12-07 NOTE — Transfer of Care (Signed)
Immediate Anesthesia Transfer of Care Note  Patient: Colleen Lozano  Procedure(s) Performed: TONSILLECTOMY (Bilateral Throat)  Patient Location: PACU  Anesthesia Type: General ETT  Level of Consciousness: awake, alert  and patient cooperative  Airway and Oxygen Therapy: Patient Spontanous Breathing and Patient connected to supplemental oxygen  Post-op Assessment: Post-op Vital signs reviewed, Patient's Cardiovascular Status Stable, Respiratory Function Stable, Patent Airway and No signs of Nausea or vomiting  Post-op Vital Signs: Reviewed and stable  Complications: No complications documented.

## 2020-12-07 NOTE — Anesthesia Postprocedure Evaluation (Signed)
Anesthesia Post Note  Patient: Colleen Lozano  Procedure(s) Performed: TONSILLECTOMY (Bilateral Throat)     Patient location during evaluation: PACU Anesthesia Type: General Level of consciousness: awake and alert and oriented Pain management: satisfactory to patient Vital Signs Assessment: post-procedure vital signs reviewed and stable Respiratory status: spontaneous breathing, nonlabored ventilation and respiratory function stable Cardiovascular status: blood pressure returned to baseline and stable Postop Assessment: Adequate PO intake and No signs of nausea or vomiting Anesthetic complications: no   No complications documented.  Cherly Beach

## 2020-12-07 NOTE — Anesthesia Procedure Notes (Signed)
Procedure Name: Intubation Date/Time: 12/07/2020 8:46 AM Performed by: Jimmy Picket, CRNA Pre-anesthesia Checklist: Patient identified, Emergency Drugs available, Suction available, Patient being monitored and Timeout performed Patient Re-evaluated:Patient Re-evaluated prior to induction Oxygen Delivery Method: Circle system utilized Preoxygenation: Pre-oxygenation with 100% oxygen Induction Type: IV induction Ventilation: Mask ventilation without difficulty Laryngoscope Size: Miller and 2 Grade View: Grade I Tube type: Oral Rae Tube size: 7.0 mm Number of attempts: 1 Placement Confirmation: ETT inserted through vocal cords under direct vision,  positive ETCO2 and breath sounds checked- equal and bilateral Tube secured with: Tape Dental Injury: Teeth and Oropharynx as per pre-operative assessment

## 2020-12-08 LAB — SURGICAL PATHOLOGY

## 2020-12-16 ENCOUNTER — Other Ambulatory Visit: Payer: Self-pay | Admitting: Otolaryngology

## 2020-12-16 ENCOUNTER — Encounter: Payer: Self-pay | Admitting: Otolaryngology

## 2020-12-16 MED ORDER — HYDROCODONE-ACETAMINOPHEN 7.5-325 MG/15ML PO SOLN: 10.0000 mL | Freq: Four times a day (QID) | ORAL | 0 refills | Status: DC | PRN

## 2021-12-04 ENCOUNTER — Ambulatory Visit
Admission: EM | Admit: 2021-12-04 | Discharge: 2021-12-04 | Disposition: A | Discharge status: Home / Self Care | Payer: Managed Care, Other (non HMO) | Attending: Physician Assistant | Admitting: Physician Assistant

## 2021-12-04 DIAGNOSIS — R519 Headache, unspecified: Secondary | ICD-10-CM | POA: Diagnosis present

## 2021-12-04 DIAGNOSIS — R300 Dysuria: Secondary | ICD-10-CM

## 2021-12-04 LAB — URINALYSIS, COMPLETE (UACMP) WITH MICROSCOPIC
Bilirubin Urine: NEGATIVE
Glucose, UA: NEGATIVE mg/dL
Hgb urine dipstick: NEGATIVE
Ketones, ur: NEGATIVE mg/dL
Leukocytes,Ua: NEGATIVE
Nitrite: NEGATIVE
Protein, ur: NEGATIVE mg/dL
Specific Gravity, Urine: 1.01 (ref 1.005–1.030)
pH: 7 (ref 5.0–8.0)

## 2021-12-04 MED ORDER — NITROFURANTOIN MONOHYD MACRO 100 MG PO CAPS: 100.0000 mg | ORAL_CAPSULE | Freq: Two times a day (BID) | ORAL | 0 refills | Status: AC

## 2021-12-04 MED ORDER — FLUCONAZOLE 150 MG PO TABS: 150.0000 mg | ORAL_TABLET | Freq: Every day | ORAL | 0 refills | Status: AC

## 2021-12-04 NOTE — ED Provider Notes (Signed)
[facility]    CSN: [id number] Arrival date & time: 12/04/21  1655      History   Chief Complaint Chief Complaint  Patient presents with   Headache   Urinary Frequency    HPI Colleen Lozano is a 21 y.o. female who presents with urinary frequency and dysuria x 5-6 days. Has telehealth visit last month for same symptoms and was placed on medication for UTI. Denies abnormal discharge or concern for STD. Has not been having urine cultures when she gets UTI's. LMP 12/27  She also has been getting HA on her forehead x 5 days. Has taken Tylenol but did not help. Denies URI symptoms or fever. Has not been sick or had allergy symptoms in the past few weeks.   Past Medical History:  Diagnosis Date   Anxiety    Epidermal nevus 18 months    There are no problems to display for this patient.   Past Surgical History:  Procedure Laterality Date   TONSILLECTOMY Bilateral 12/07/2020   Procedure: TONSILLECTOMY;  Surgeon: Margaretha Sheffield, MD;  Location: [place];  Service: ENT;  Laterality: Bilateral;   WISDOM TOOTH EXTRACTION      OB History     Gravida  0   Para  0   Term  0   Preterm  0   AB  0   Living  0      SAB  0   IAB  0   Ectopic  0   Multiple  0   Live Births  0            Home Medications    Prior to Admission medications   Medication Sig Start Date End Date Taking? Authorizing Provider  acitretin (SORIATANE) 25 MG capsule Take 35 mg by mouth daily before breakfast.   Yes [provider]  fluconazole (DIFLUCAN) 150 MG tablet Take 1 tablet (150 mg total) by mouth daily. 12/04/21  Yes Rodriguez-Southworth, Sunday Spillers, PA-C  levocetirizine (XYZAL) 5 MG tablet Take 5 mg by mouth every evening.   Yes [provider]  montelukast (SINGULAIR) 10 MG tablet Take 10 mg by mouth at bedtime.   Yes [provider]  nitrofurantoin, macrocrystal-monohydrate, (MACROBID) 100 MG capsule Take 1 capsule (100 mg total) by  mouth 2 (two) times daily. 12/04/21  Yes Rodriguez-Southworth, Sunday Spillers, PA-C  sertraline (ZOLOFT) 50 MG tablet  03/02/19  Yes [provider]  Levonorgestrel (KYLEENA) 19.5 MG IUD 1 each (19.5 mg total) by Intrauterine route once for 1 dose. 01/28/18 08/04/80  Copland, Deirdre Evener, PA-C  fluticasone (FLONASE) 50 MCG/ACT nasal spray Place 1 spray into both nostrils daily.  09/21/19  [provider]    Family History Family History  Problem Relation Age of Onset   Hypertension Mother    Hypertension Father     Social History Social History   Tobacco Use   Smoking status: Never   Smokeless tobacco: Never  Vaping Use   Vaping Use: Former   Substances: Nicotine, Flavoring   Devices: [place]  Substance Use Topics   Alcohol use: No   Drug use: No     Allergies   Clindamycin/lincomycin and Acrylic polymer [carbomer]   Review of Systems Review of Systems  Genitourinary:  Positive for dysuria and frequency.    Physical Exam Triage Vital Signs ED Triage Vitals  Enc Vitals Group     BP 12/04/21 1750 137/89     Pulse Rate 12/04/21 1750 69  Resp 12/04/21 1750 18     Temp 12/04/21 1750 98.7 F (37.1 C)     Temp Source 12/04/21 1750 Oral     SpO2 12/04/21 1750 100 %     Weight 12/04/21 1748 155 lb (70.3 kg)     Height 12/04/21 1748 5\' 9"  (1.753 m)     Head Circumference --      Peak Flow --      Pain Score 12/04/21 1748 5     Pain Loc --      Pain Edu? --      Excl. in [place]? --    No data found.  Updated Vital Signs BP 137/89 (BP Location: Left Arm)    Pulse 69    Temp 98.7 F (37.1 C) (Oral)    Resp 18    Ht 5\' 9"  (1.753 m)    Wt 155 lb (70.3 kg)    LMP 11/27/2021    SpO2 100%    BMI 22.89 kg/m   Visual Acuity Right Eye Distance:   Left Eye Distance:   Bilateral Distance:    Right Eye Near:   Left Eye Near:    Bilateral Near:     Physical Exam Vitals and nursing note reviewed.  Constitutional:      General: She is not in acute distress.     Appearance: She is not toxic-appearing.  HENT:     Head: Normocephalic.     Right Ear: Tympanic membrane, ear canal and external ear normal.     Left Ear: Tympanic membrane, ear canal and external ear normal.     Nose: Nose normal.     Comments: Has tenderness on frontal sinuses, L ethmoid and both maxillary sinuses.     Mouth/Throat:     Mouth: Mucous membranes are moist.  Eyes:     General: No scleral icterus.    Conjunctiva/sclera: Conjunctivae normal.     Pupils: Pupils are equal, round, and reactive to light.  Pulmonary:     Effort: Pulmonary effort is normal.  Abdominal:     General: Bowel sounds are normal.     Palpations: Abdomen is soft.     Tenderness: There is no abdominal tenderness. There is no right CVA tenderness or left CVA tenderness.  Musculoskeletal:        General: Normal range of motion.     Cervical back: Neck supple.  Lymphadenopathy:     Cervical: No cervical adenopathy.  Skin:    General: Skin is warm and dry.  Neurological:     Mental Status: She is alert and oriented to person, place, and time.     Gait: Gait normal.  Psychiatric:        Mood and Affect: Mood normal.        Behavior: Behavior normal.        Thought Content: Thought content normal.        Judgment: Judgment normal.     UC Treatments / Results  Labs (all labs ordered are listed, but only abnormal results are displayed) Labs Reviewed  URINALYSIS, COMPLETE (UACMP) WITH MICROSCOPIC - Abnormal; Notable for the following components:      Result Value   Color, Urine STRAW (*)    Bacteria, UA RARE (*)    All other components within normal limits  URINE CULTURE    EKG   Radiology No results found.  Procedures Procedures (including critical care time)  Medications Ordered in UC Medications - No data to display  Initial Impression / Assessment and Plan / UC Course  I have reviewed the triage vital signs and the nursing notes. Pertinent labs  results that were available  during my care of the patient were reviewed by me and considered in my medical decision making (see chart for details). Dysuria and sinus HA Urine was sent out for a culture and we will call her with results In the mean time I placed her on Macrobid and Diflucan. I educated her on the importance of having a urine culture done to confirm she has a true UTI.     Final Clinical Impressions(s) / UC Diagnoses   Final diagnoses:  Dysuria  Sinus headache     Discharge Instructions      You may get Boric acid for BV Take Sinus Advil for sinus headache We will call you if your culture shows infection and if we need to change your antibiotic.   You may also get cranberry pills for bladder health you may take to prevent them      ED Prescriptions     Medication Sig Dispense Auth. Provider   nitrofurantoin, macrocrystal-monohydrate, (MACROBID) 100 MG capsule Take 1 capsule (100 mg total) by mouth 2 (two) times daily. 6 capsule Rodriguez-Southworth, Sunday Spillers, PA-C   fluconazole (DIFLUCAN) 150 MG tablet Take 1 tablet (150 mg total) by mouth daily. 2 tablet Rodriguez-Southworth, Sunday Spillers, PA-C      PDMP not reviewed this encounter.   Shelby Mattocks, Hershal Coria 12/04/21 2006

## 2021-12-04 NOTE — Discharge Instructions (Addendum)
You may get Boric acid for BV Take Sinus Advil for sinus headache We will call you if your culture shows infection and if we need to change your antibiotic.   You may also get cranberry pills for bladder health you may take to prevent them

## 2021-12-04 NOTE — ED Triage Notes (Signed)
Pt c/o Urinary burning, urinary frequency, headache x5-6days

## 2021-12-06 LAB — URINE CULTURE

## 2022-05-14 ENCOUNTER — Ambulatory Visit: Payer: Managed Care, Other (non HMO) | Admitting: Internal Medicine
